# Patient Record
Sex: Male | Born: 1990 | Race: White | Hispanic: No | Marital: Single | Smoking: Never smoker
Health system: Southern US, Community
[De-identification: ages and names within clinical notes are randomized; demographics above are authoritative.]

## PROBLEM LIST (undated history)

## (undated) HISTORY — PX: TONSILLECTOMY: SUR1361

---

## 2011-05-18 ENCOUNTER — Emergency Department (INDEPENDENT_AMBULATORY_CARE_PROVIDER_SITE_OTHER): Payer: PRIVATE HEALTH INSURANCE

## 2011-05-18 ENCOUNTER — Emergency Department (HOSPITAL_BASED_OUTPATIENT_CLINIC_OR_DEPARTMENT_OTHER)
Admission: EM | Admit: 2011-05-18 | Discharge: 2011-05-18 | Disposition: A | Discharge status: Home / Self Care | Payer: PRIVATE HEALTH INSURANCE | Attending: Emergency Medicine | Admitting: Emergency Medicine

## 2011-05-18 ENCOUNTER — Encounter: Payer: Self-pay | Admitting: *Deleted

## 2011-05-18 DIAGNOSIS — M25552 Pain in left hip: Secondary | ICD-10-CM

## 2011-05-18 DIAGNOSIS — M79609 Pain in unspecified limb: Secondary | ICD-10-CM

## 2011-05-18 DIAGNOSIS — M25559 Pain in unspecified hip: Secondary | ICD-10-CM | POA: Insufficient documentation

## 2011-05-18 DIAGNOSIS — R109 Unspecified abdominal pain: Secondary | ICD-10-CM

## 2011-05-18 MED ORDER — IBUPROFEN 600 MG PO TABS: 600.0000 mg | ORAL_TABLET | Freq: Four times a day (QID) | ORAL | Status: AC | PRN

## 2011-05-18 MED ORDER — HYDROCODONE-ACETAMINOPHEN 5-325 MG PO TABS: 1.0000 | ORAL_TABLET | ORAL | Status: AC | PRN
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
## 2014-08-31 ENCOUNTER — Emergency Department (HOSPITAL_BASED_OUTPATIENT_CLINIC_OR_DEPARTMENT_OTHER): Payer: BLUE CROSS/BLUE SHIELD

## 2014-08-31 ENCOUNTER — Encounter (HOSPITAL_BASED_OUTPATIENT_CLINIC_OR_DEPARTMENT_OTHER): Payer: Self-pay

## 2014-08-31 ENCOUNTER — Emergency Department (HOSPITAL_BASED_OUTPATIENT_CLINIC_OR_DEPARTMENT_OTHER)
Admission: EM | Admit: 2014-08-31 | Discharge: 2014-08-31 | Disposition: A | Discharge status: Home / Self Care | Payer: BLUE CROSS/BLUE SHIELD | Attending: Emergency Medicine | Admitting: Emergency Medicine

## 2014-08-31 DIAGNOSIS — R1012 Left upper quadrant pain: Secondary | ICD-10-CM | POA: Diagnosis not present

## 2014-08-31 DIAGNOSIS — R109 Unspecified abdominal pain: Secondary | ICD-10-CM

## 2014-08-31 DIAGNOSIS — M549 Dorsalgia, unspecified: Secondary | ICD-10-CM | POA: Insufficient documentation

## 2014-08-31 DIAGNOSIS — R110 Nausea: Secondary | ICD-10-CM | POA: Insufficient documentation

## 2014-08-31 LAB — CBC WITH DIFFERENTIAL/PLATELET
BASOS ABS: 0 10*3/uL (ref 0.0–0.1)
BASOS PCT: 0 % (ref 0–1)
EOS ABS: 0.2 10*3/uL (ref 0.0–0.7)
EOS PCT: 2 % (ref 0–5)
HEMATOCRIT: 41.2 % (ref 39.0–52.0)
HEMOGLOBIN: 14.8 g/dL (ref 13.0–17.0)
LYMPHS PCT: 28 % (ref 12–46)
Lymphs Abs: 2.5 10*3/uL (ref 0.7–4.0)
MCH: 31.9 pg (ref 26.0–34.0)
MCHC: 35.9 g/dL (ref 30.0–36.0)
MCV: 88.8 fL (ref 78.0–100.0)
MONO ABS: 0.8 10*3/uL (ref 0.1–1.0)
Monocytes Relative: 9 % (ref 3–12)
Neutro Abs: 5.5 10*3/uL (ref 1.7–7.7)
Neutrophils Relative %: 61 % (ref 43–77)
Platelets: 314 10*3/uL (ref 150–400)
RBC: 4.64 MIL/uL (ref 4.22–5.81)
RDW: 11.6 % (ref 11.5–15.5)
WBC: 9.1 10*3/uL (ref 4.0–10.5)

## 2014-08-31 LAB — URINALYSIS, ROUTINE W REFLEX MICROSCOPIC
Bilirubin Urine: NEGATIVE
GLUCOSE, UA: NEGATIVE mg/dL
HGB URINE DIPSTICK: NEGATIVE
KETONES UR: NEGATIVE mg/dL
LEUKOCYTES UA: NEGATIVE
Nitrite: NEGATIVE
PH: 6.5 (ref 5.0–8.0)
PROTEIN: NEGATIVE mg/dL
Specific Gravity, Urine: 1.009 (ref 1.005–1.030)
Urobilinogen, UA: 1 mg/dL (ref 0.0–1.0)

## 2014-08-31 LAB — BASIC METABOLIC PANEL
ANION GAP: 8 (ref 5–15)
BUN: 15 mg/dL (ref 6–23)
CO2: 27 mmol/L (ref 19–32)
Calcium: 9.6 mg/dL (ref 8.4–10.5)
Chloride: 102 mEq/L (ref 96–112)
Creatinine, Ser: 0.93 mg/dL (ref 0.50–1.35)
GFR calc Af Amer: >90 mL/min (ref >90)
GFR calc non Af Amer: >90 mL/min (ref >90)
GLUCOSE: 109 mg/dL — AB (ref 70–99)
POTASSIUM: 3.9 mmol/L (ref 3.5–5.1)
SODIUM: 137 mmol/L (ref 135–145)

## 2014-08-31 LAB — LIPASE, BLOOD: Lipase: 26 U/L (ref 11–59)

## 2014-08-31 MED ORDER — OXYCODONE-ACETAMINOPHEN 5-325 MG PO TABS: 1.0000 | ORAL_TABLET | Freq: Four times a day (QID) | ORAL | Status: AC | PRN

## 2014-08-31 MED ORDER — SODIUM CHLORIDE 0.9 % IV BOLUS (SEPSIS)
1000.0000 mL | Freq: Once | INTRAVENOUS | Status: AC
  Administered 2014-08-31: 1000 mL via INTRAVENOUS

## 2014-08-31 MED ORDER — HYDROMORPHONE HCL 1 MG/ML IJ SOLN
1.0000 mg | Freq: Once | INTRAMUSCULAR | Status: AC
  Administered 2014-08-31: 1 mg via INTRAVENOUS
  Filled 2014-08-31: qty 1

## 2014-08-31 MED ORDER — ONDANSETRON HCL 4 MG/2ML IJ SOLN
4.0000 mg | Freq: Once | INTRAMUSCULAR | Status: AC
  Administered 2014-08-31: 4 mg via INTRAVENOUS
  Filled 2014-08-31: qty 2

## 2014-08-31 MED ORDER — HYDROMORPHONE HCL 1 MG/ML IJ SOLN
0.5000 mg | Freq: Once | INTRAMUSCULAR | Status: AC
  Administered 2014-08-31: 0.5 mg via INTRAVENOUS
  Filled 2014-08-31: qty 1

## 2014-08-31 MED ORDER — ONDANSETRON HCL 4 MG PO TABS: 4.0000 mg | ORAL_TABLET | Freq: Four times a day (QID) | ORAL | Status: AC

## 2014-08-31 NOTE — ED Notes (Signed)
Pt with L flank pain which radiates to luq, nausea with episodes, denies dysuria.  Went to u/c this morning and had trace blood in urine.

## 2014-08-31 NOTE — Discharge Instructions (Signed)
Follow-up with urology. Return to the ER with any severe abdominal pain, nausea, vomiting, worsening of symptoms, high fever, difficulty urinating.  Flank Pain Flank pain refers to pain that is located on the side of the body between the upper abdomen and the back. The pain may occur over a short period of time (acute) or may be long-term or reoccurring (chronic). It may be mild or severe. Flank pain can be caused by many things. CAUSES  Some of the more common causes of flank pain include:  Muscle strains.   Muscle spasms.   A disease of your spine (vertebral disk disease).   A lung infection (pneumonia).   Fluid around your lungs (pulmonary edema).   A kidney infection.   Kidney stones.   A very painful skin rash caused by the chickenpox virus (shingles).   Gallbladder disease.  HOME CARE INSTRUCTIONS  Home care will depend on the cause of your pain. In general,  Rest as directed by your caregiver.  Drink enough fluids to keep your urine clear or pale yellow.  Only take over-the-counter or prescription medicines as directed by your caregiver. Some medicines may help relieve the pain.  Tell your caregiver about any changes in your pain.  Follow up with your caregiver as directed. SEEK IMMEDIATE MEDICAL CARE IF:   Your pain is not controlled with medicine.   You have new or worsening symptoms.  Your pain increases.   You have abdominal pain.   You have shortness of breath.   You have persistent nausea or vomiting.   You have swelling in your abdomen.   You feel faint or pass out.   You have blood in your urine.  You have a fever or persistent symptoms for more than 2-3 days.  You have a fever and your symptoms suddenly get worse. MAKE SURE YOU:   Understand these instructions.  Will watch your condition.  Will get help right away if you are not doing well or get worse. Document Released: 10/05/2005 Document Revised: 05/08/2012 Document  Reviewed: 03/28/2012 [facility] Patient Information 2015 [place], [place]. This information is not intended to replace advice given to you by your health care provider. Make sure you discuss any questions you have with your health care provider.

## 2014-08-31 NOTE — ED Provider Notes (Signed)
CSN: [id number]     Arrival date & time 08/31/14  1540 History   First MD Initiated Contact with Patient 08/31/14 1600     Chief Complaint  Patient presents with  . Flank Pain     (Consider location/radiation/quality/duration/timing/severity/associated sxs/prior Treatment) HPI Mr. Rubi is a 24 year old male with no significant past medical history who presents the ER complaining of 3 days of left-sided flank pain. Patient reports his pain began gradually, 3 days ago with no specific activity during onset. Patient reports his pain has gradually worsened, at its worst last night. Patient is unable to describe the pain, however states it comes and goes periodically, has no aggravating or alleviating factors. Patient reports the pain radiates into his left upper abdomen Patient reports associated nausea with no vomiting. Patient states he was seen at urgent care this morning and urgent care noted he had trace hematuria. Patient denies fever, chest pain, shortness of breath, dizziness, headache, diarrhea, dysuria.  History reviewed. No pertinent past medical history. Past Surgical History  Procedure Laterality Date  . Tonsillectomy     No family history on file. History  Substance Use Topics  . Smoking status: Never Smoker   . Smokeless tobacco: Not on file  . Alcohol Use: Yes     Comment: occ    Review of Systems  Constitutional: Negative for fever.  HENT: Negative for trouble swallowing.   Eyes: Negative for visual disturbance.  Respiratory: Negative for shortness of breath.   Cardiovascular: Negative for chest pain.  Gastrointestinal: Positive for nausea and abdominal pain. Negative for vomiting.  Genitourinary: Negative for dysuria.  Musculoskeletal: Positive for back pain. Negative for neck pain.  Skin: Negative for rash.  Neurological: Negative for dizziness, weakness and numbness.  Psychiatric/Behavioral: Negative.       Allergies  Clindamycin/lincomycin and Sulfa  antibiotics  Home Medications   Prior to Admission medications   Medication Sig Start Date End Date Taking? Authorizing Provider  ondansetron (ZOFRAN) 4 MG tablet Take 1 tablet (4 mg total) by mouth every 6 (six) hours. 08/31/14   Monte Fantasia, PA-C  oxyCODONE-acetaminophen (PERCOCET) 5-325 MG per tablet Take 1-2 tablets by mouth every 6 (six) hours as needed. 08/31/14   Monte Fantasia, PA-C   BP 121/68 mmHg  Pulse 93  Temp(Src) 98.1 F (36.7 C) (Oral)  Resp 18  Ht 6' (1.829 m)  Wt 195 lb (88.451 kg)  BMI 26.44 kg/m2  SpO2 100% Physical Exam  Constitutional: He is oriented to person, place, and time. He appears well-developed and well-nourished. No distress.  HENT:  Head: Normocephalic and atraumatic.  Mouth/Throat: Oropharynx is clear and moist. No oropharyngeal exudate.  Eyes: Right eye exhibits no discharge. Left eye exhibits no discharge. No scleral icterus.  Neck: Normal range of motion.  Cardiovascular: Normal rate, regular rhythm and normal heart sounds.   No murmur heard. Pulmonary/Chest: Effort normal and breath sounds normal. No respiratory distress.  Abdominal: Soft. Normal appearance and bowel sounds are normal. There is tenderness in the left upper quadrant. There is CVA tenderness.  Mild tenderness in left upper quadrant. Patient has left-sided CVA tenderness.  Musculoskeletal: Normal range of motion. He exhibits no edema or tenderness.  Neurological: He is alert and oriented to person, place, and time. He has normal strength. No cranial nerve deficit. Coordination normal. GCS eye subscore is 4. GCS verbal subscore is 5. GCS motor subscore is 6.  Patient fully alert answering questions appropriately in full, clear sentences. Cranial nerves II  through XII grossly intact. Motor strength 5 out of 5 in all major muscle groups of upper and lower extremity. Distal sensation intact.  Skin: Skin is warm and dry. No rash noted. He is not diaphoretic.  Psychiatric: He has a normal  mood and affect.  Nursing note and vitals reviewed.   ED Course  Procedures (including critical care time) Labs Review Labs Reviewed  BASIC METABOLIC PANEL - Abnormal; Notable for the following:    Glucose, Bld 109 (*)    All other components within normal limits  URINALYSIS, ROUTINE W REFLEX MICROSCOPIC  CBC WITH DIFFERENTIAL  LIPASE, BLOOD    Imaging Review Ct Renal Stone Study  08/31/2014   CLINICAL DATA:  Left flank pain.  EXAM: CT ABDOMEN AND PELVIS WITHOUT CONTRAST  TECHNIQUE: Multidetector CT imaging of the abdomen and pelvis was performed following the standard protocol without IV contrast.  COMPARISON:  None.  FINDINGS: No urinary tract calculi identified. There is no evidence of hydronephrosis. Unenhanced appearance of the liver, gallbladder, pancreas, spleen, adrenal glands are within normal limits.  Bowel loops are of normal caliber. No inflammatory process or fluid collections identified. No masses or enlarged lymph nodes are seen. No hernias are identified. Bony structures are within normal limits.  IMPRESSION: Normal CT of the abdomen without contrast. No evidence of urinary tract calculi.   Electronically Signed   By: Irish Lack M.D.   On: 08/31/2014 17:12     EKG Interpretation None      MDM   Final diagnoses:  Left flank pain    Patient 3 days of flank pain, mild hematuria noted in urgent care. Patient having pain on exam here today, pain is in left flank with mild radiation to left upper quadrant abdomen. Today the ER no hematuria noted. Patient's history and physical consistent with a possible kidney stone, will follow up with CT abdomen pelvis.  Labs unremarkable for acute pathology. No leukocytosis, anemia, elect slight abnormality. Renal function intact. UA unremarkable. CT abdomen pelvis renal stone study with impression: Normal CT of the abdomen without contrast. No evidence of urinary tract calculi.  Workup largely unremarkable here today. Patient's  history and physical consistent with kidney stone, however there is no evidence of it on lab or imaging here. It is possible that patient has a past kidney stone while in the ER, or just previous to coming to the ER. Patient is nontoxic, nonseptic appearing, in no apparent distress.  Patient's pain and other symptoms adequately managed in emergency department.  Fluid bolus given.  Labs, imaging and vitals reviewed.  Patient does not meet the SIRS or Sepsis criteria.  On repeat exam patient does not have a surgical abdomin and there are no peritoneal signs.  No indication of appendicitis, bowel obstruction, bowel perforation, cholecystitis, diverticulitis, .  Patient discharged home with symptomatic treatment and given strict instructions for follow-up with their primary care physician and with urology.  I have also discussed reasons to return immediately to the ER.  Patient expresses understanding and agrees with plan. I encouraged patient to call or return to the ER should he have any questions or concerns.  BP 121/68 mmHg  Pulse 93  Temp(Src) 98.1 F (36.7 C) (Oral)  Resp 18  Ht 6' (1.829 m)  Wt 195 lb (88.451 kg)  BMI 26.44 kg/m2  SpO2 100%  Signed,  Ladona Mow, PA-C 2:28 AM  Patient discussed with Dr. Mirian Mo, MD  Monte Fantasia, PA-C 09/01/14 1610  Mirian Mo,  MD 09/01/14 1528

## 2015-10-06 IMAGING — CT CT RENAL STONE PROTOCOL
2 of 4 series · 17 of 46 positions shown, 19 images · non-contrast
Comparison: None.

CLINICAL DATA: Left flank pain.

EXAM:
CT ABDOMEN AND PELVIS WITHOUT CONTRAST
TECHNIQUE: Multidetector CT imaging of the abdomen and pelvis was performed
following the standard protocol without IV contrast.

[Series 2: renal stone < 200 lbs 5.0 b31f · axial · 0.81mm/px · z∈[-531,-116]mm · 14 of 91 slices shown, 16 images]
[im 4/91  soft-tissue]
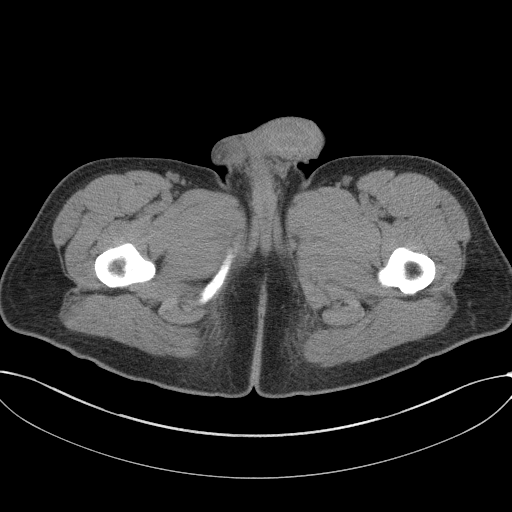
[im 4/91  bone]
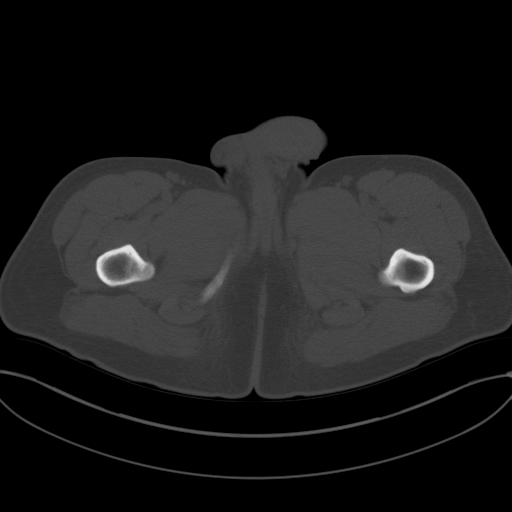
[im 11/91  soft-tissue]
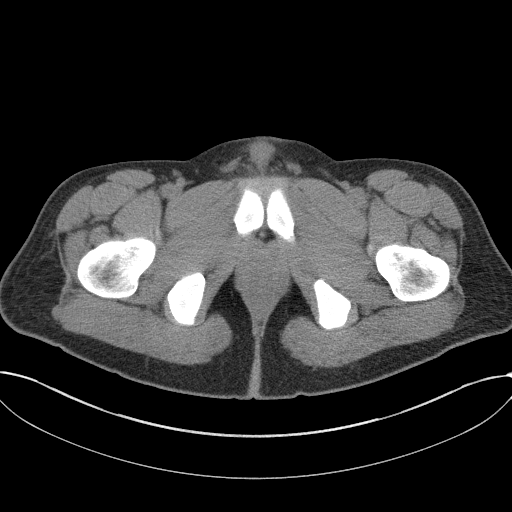
[im 19/91  soft-tissue]
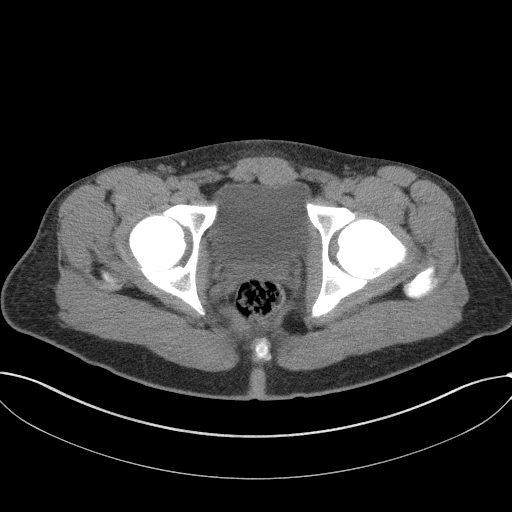
[im 26/91  soft-tissue]
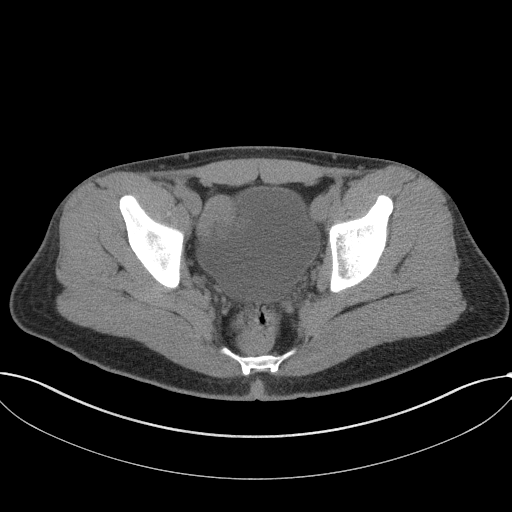
[im 29/91  soft-tissue]
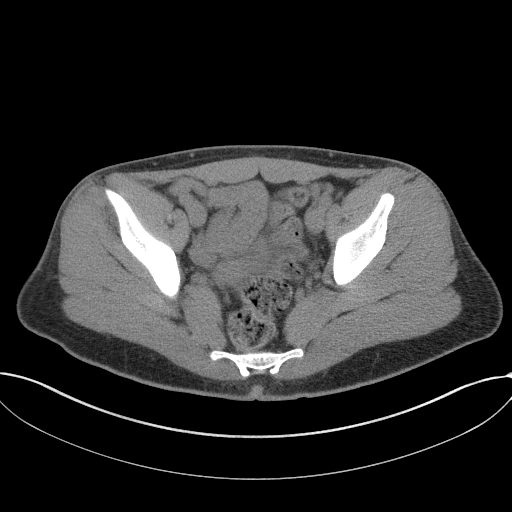
[im 37/91  soft-tissue]
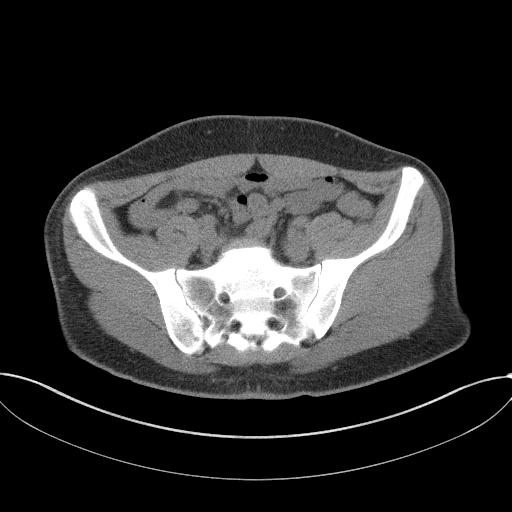
[im 44/91  soft-tissue]
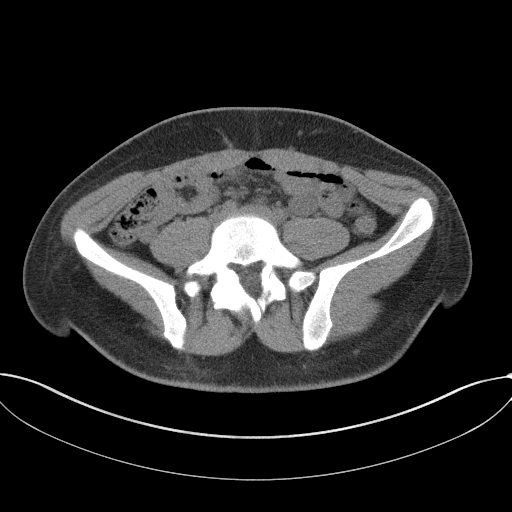
[im 47/91  soft-tissue]
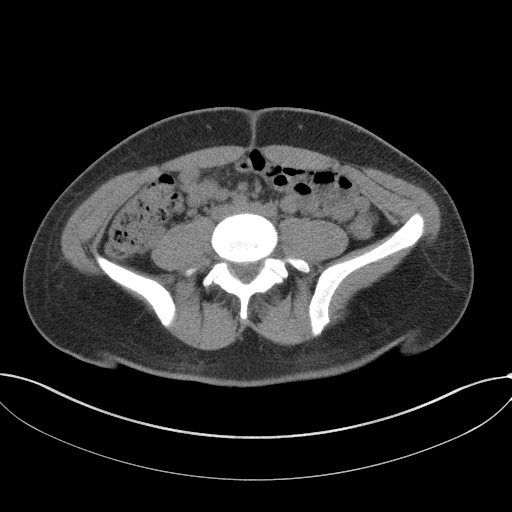
[im 55/91  soft-tissue]
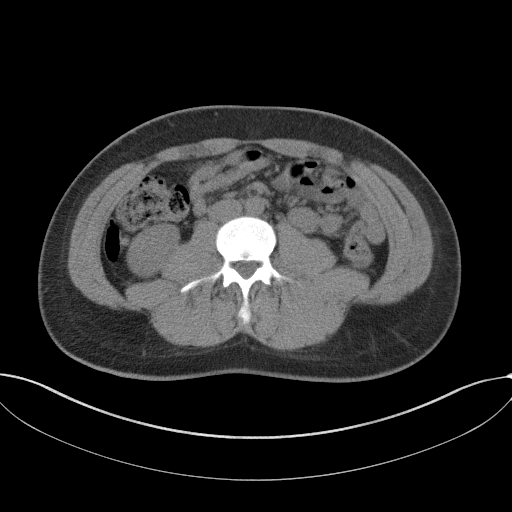
[im 55/91  bone]
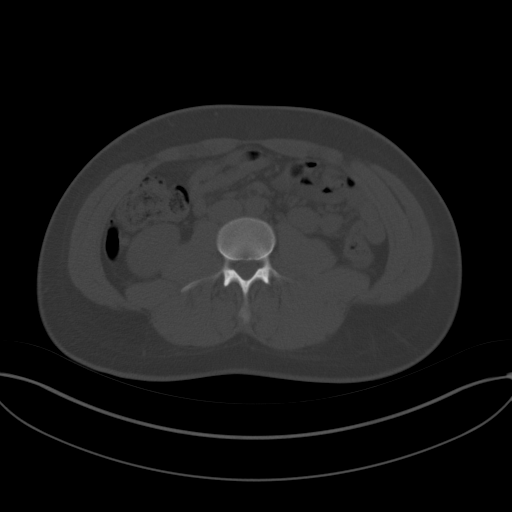
[im 62/91  soft-tissue]
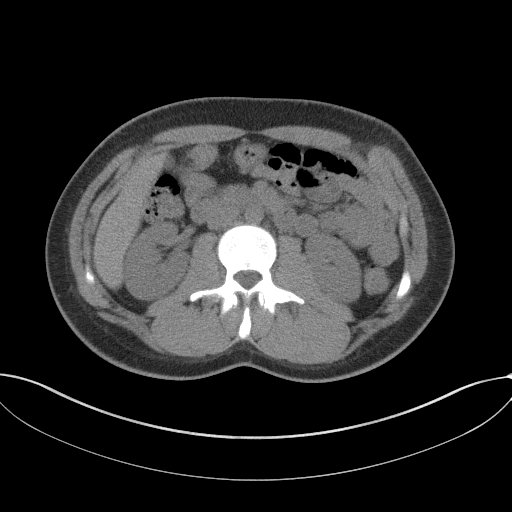
[im 69/91  soft-tissue]
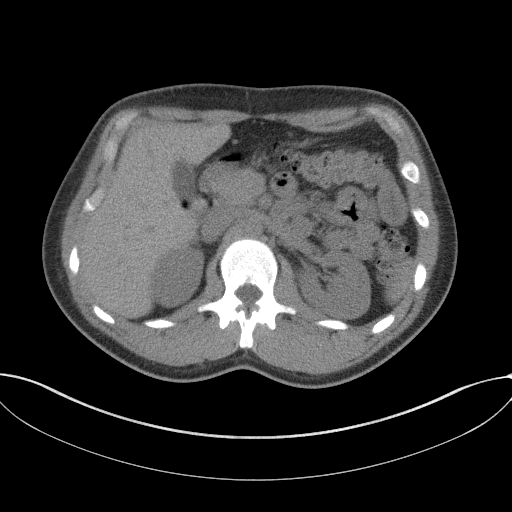
[im 73/91  soft-tissue]
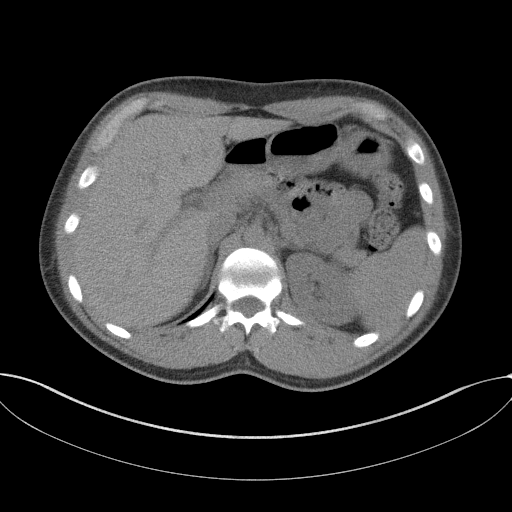
[im 80/91  soft-tissue]
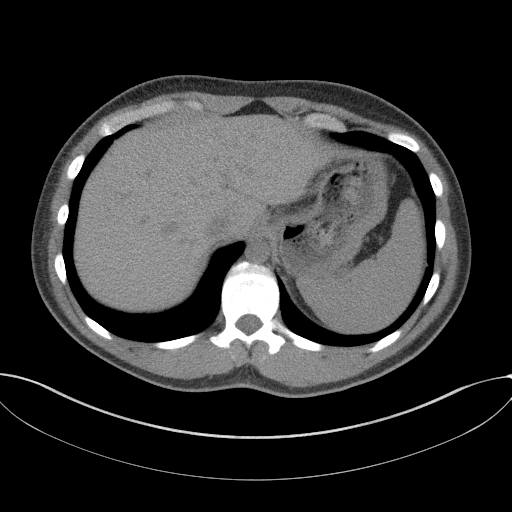
[im 87/91  soft-tissue]
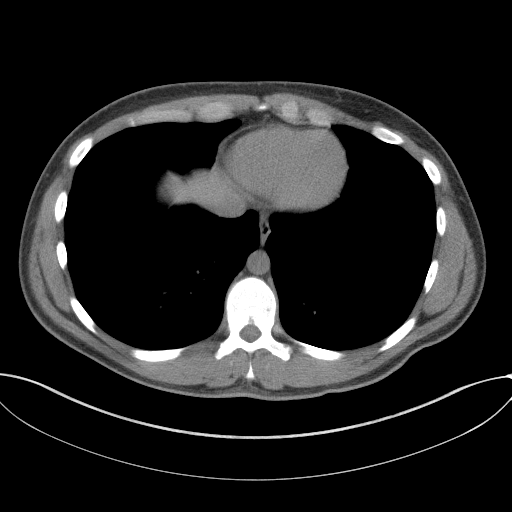

[Series 5: renal stone 3.0 coronal · coronal · 0.78mm/px · 3 of 76 slices shown]
[im 26/76  soft-tissue]
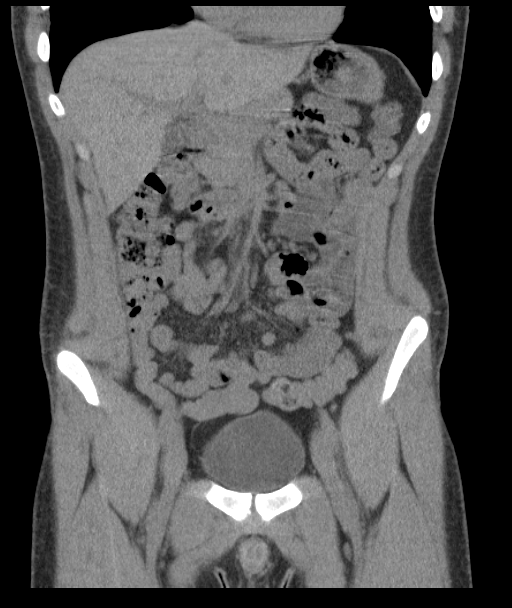
[im 34/76  soft-tissue]
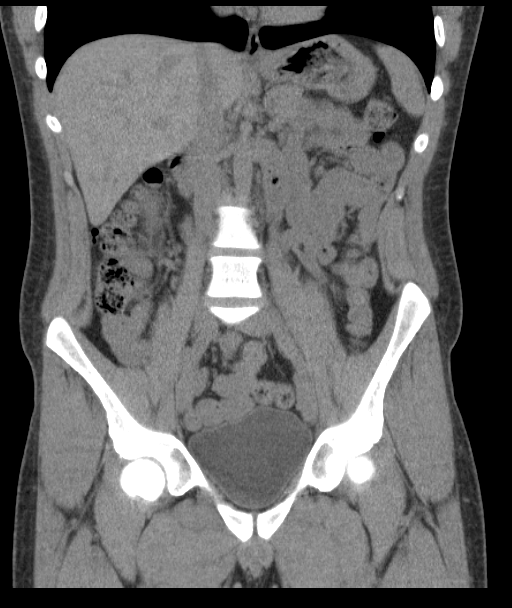
[im 42/76  soft-tissue]
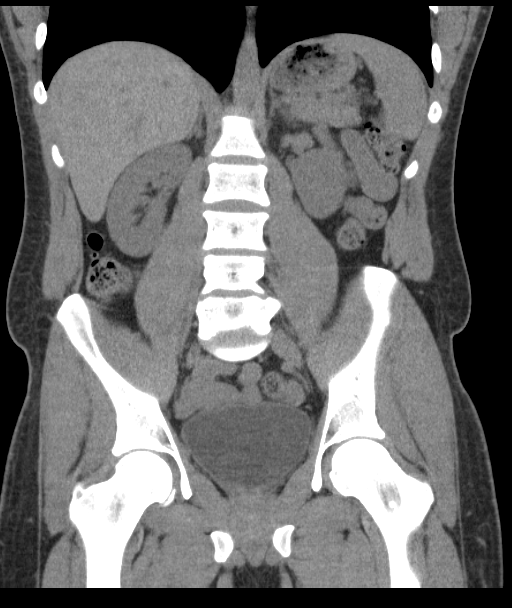

[17 of 46 positions shown; findings below may reference images not displayed]

FINDINGS: No urinary tract calculi identified. There is no evidence of
hydronephrosis. Unenhanced appearance of the liver, gallbladder,
pancreas, spleen, adrenal glands are within normal limits.

Bowel loops are of normal caliber. No inflammatory process or fluid
collections identified. No masses or enlarged lymph nodes are seen.
No hernias are identified. Bony structures are within normal limits.
IMPRESSION: Normal CT of the abdomen without contrast. No evidence of urinary
tract calculi.
